# Patient Record
Sex: Female | Born: 1995
Health system: Southern US, Community
[De-identification: ages and names within clinical notes are randomized; demographics above are authoritative.]

---

## 2019-07-19 DIAGNOSIS — Z3A35 35 weeks gestation of pregnancy: Secondary | ICD-10-CM | POA: Diagnosis not present

## 2019-07-19 DIAGNOSIS — Z3685 Encounter for antenatal screening for Streptococcus B: Secondary | ICD-10-CM | POA: Diagnosis not present

## 2019-07-19 DIAGNOSIS — Z3689 Encounter for other specified antenatal screening: Secondary | ICD-10-CM | POA: Diagnosis not present

## 2019-07-19 DIAGNOSIS — Z3403 Encounter for supervision of normal first pregnancy, third trimester: Secondary | ICD-10-CM | POA: Diagnosis not present

## 2019-08-10 DIAGNOSIS — Z3493 Encounter for supervision of normal pregnancy, unspecified, third trimester: Secondary | ICD-10-CM | POA: Diagnosis not present

## 2019-08-10 DIAGNOSIS — Z3A36 36 weeks gestation of pregnancy: Secondary | ICD-10-CM | POA: Diagnosis not present

## 2019-08-19 DIAGNOSIS — O99344 Other mental disorders complicating childbirth: Secondary | ICD-10-CM | POA: Diagnosis not present

## 2019-08-19 DIAGNOSIS — O480 Post-term pregnancy: Secondary | ICD-10-CM | POA: Diagnosis not present

## 2019-08-19 DIAGNOSIS — Z87891 Personal history of nicotine dependence: Secondary | ICD-10-CM | POA: Diagnosis not present

## 2019-08-19 DIAGNOSIS — F329 Major depressive disorder, single episode, unspecified: Secondary | ICD-10-CM | POA: Diagnosis not present

## 2019-08-19 DIAGNOSIS — E669 Obesity, unspecified: Secondary | ICD-10-CM | POA: Diagnosis not present

## 2019-08-19 DIAGNOSIS — Z3A40 40 weeks gestation of pregnancy: Secondary | ICD-10-CM | POA: Diagnosis not present

## 2019-08-19 DIAGNOSIS — O99214 Obesity complicating childbirth: Secondary | ICD-10-CM | POA: Diagnosis not present

## 2019-08-19 DIAGNOSIS — Z20822 Contact with and (suspected) exposure to covid-19: Secondary | ICD-10-CM | POA: Diagnosis not present

## 2019-09-01 DIAGNOSIS — D649 Anemia, unspecified: Secondary | ICD-10-CM | POA: Diagnosis not present

## 2019-09-01 DIAGNOSIS — Z98891 History of uterine scar from previous surgery: Secondary | ICD-10-CM | POA: Diagnosis not present

## 2019-10-02 DIAGNOSIS — O9081 Anemia of the puerperium: Secondary | ICD-10-CM | POA: Diagnosis not present

## 2020-01-12 ENCOUNTER — Emergency Department (HOSPITAL_COMMUNITY): Payer: BC Managed Care – PPO

## 2020-01-12 ENCOUNTER — Ambulatory Visit: Payer: Self-pay

## 2020-01-12 ENCOUNTER — Other Ambulatory Visit: Payer: Self-pay

## 2020-01-12 ENCOUNTER — Encounter (HOSPITAL_COMMUNITY): Payer: Self-pay

## 2020-01-12 ENCOUNTER — Emergency Department (HOSPITAL_COMMUNITY)
Admission: EM | Admit: 2020-01-12 | Discharge: 2020-01-12 | Disposition: A | Discharge status: Home / Self Care | Payer: BC Managed Care – PPO | Attending: Emergency Medicine | Admitting: Emergency Medicine

## 2020-01-12 DIAGNOSIS — R0789 Other chest pain: Secondary | ICD-10-CM | POA: Diagnosis not present

## 2020-01-12 DIAGNOSIS — R0602 Shortness of breath: Secondary | ICD-10-CM | POA: Diagnosis not present

## 2020-01-12 DIAGNOSIS — F17210 Nicotine dependence, cigarettes, uncomplicated: Secondary | ICD-10-CM | POA: Insufficient documentation

## 2020-01-12 DIAGNOSIS — R079 Chest pain, unspecified: Secondary | ICD-10-CM | POA: Insufficient documentation

## 2020-01-12 LAB — BASIC METABOLIC PANEL
Anion gap: 8 (ref 5–15)
BUN: 17 mg/dL (ref 6–20)
CO2: 26 mmol/L (ref 22–32)
Calcium: 8.7 mg/dL — ABNORMAL LOW (ref 8.9–10.3)
Chloride: 105 mmol/L (ref 98–111)
Creatinine, Ser: 0.79 mg/dL (ref 0.44–1.00)
GFR calc Af Amer: >60 mL/min (ref >60)
GFR calc non Af Amer: >60 mL/min (ref >60)
Glucose, Bld: 84 mg/dL (ref 70–99)
Potassium: 4.1 mmol/L (ref 3.5–5.1)
Sodium: 139 mmol/L (ref 135–145)

## 2020-01-12 LAB — I-STAT BETA HCG BLOOD, ED (MC, WL, AP ONLY): I-stat hCG, quantitative: <5 m[IU]/mL (ref <5)

## 2020-01-12 LAB — CBC
HCT: 39.4 % (ref 36.0–46.0)
Hemoglobin: 12 g/dL (ref 12.0–15.0)
MCH: 25.6 pg — ABNORMAL LOW (ref 26.0–34.0)
MCHC: 30.5 g/dL (ref 30.0–36.0)
MCV: 84 fL (ref 80.0–100.0)
Platelets: 248 10*3/uL (ref 150–400)
RBC: 4.69 MIL/uL (ref 3.87–5.11)
RDW: 14.1 % (ref 11.5–15.5)
WBC: 5.9 10*3/uL (ref 4.0–10.5)
nRBC: 0 % (ref 0.0–0.2)

## 2020-01-12 LAB — TROPONIN I (HIGH SENSITIVITY): Troponin I (High Sensitivity): <2 ng/L (ref <18)

## 2020-01-12 MED ORDER — SODIUM CHLORIDE 0.9% FLUSH: 3.0000 mL | Freq: Once | INTRAVENOUS | Status: DC

## 2020-01-12 NOTE — Discharge Instructions (Addendum)
You were seen here for chest pain.  Labs and imaging all looked reassuring.  You can take Tylenol for pain, take every 6 hours please follow dosing instructions.  I have given you the contact information for [facility] they work with patients who have little to no insurance.  Please call them Monday to obtain a primary care provider and follow-up with them if pain persist after 1 week.  I want to come back to emergency department if you develop severe chest pain, shortness of breath, uncontrolled nausea, vomiting, fever as he symptoms require further evaluation and management.

## 2020-01-12 NOTE — ED Triage Notes (Signed)
Patient c/o intermittent mid chest pain x 4 days. Patient states occasional SOB. Patient states she now has mid back pain as well that started today. Patient denies any N/V or diaphoresis.

## 2020-01-12 NOTE — ED Provider Notes (Signed)
[facility]-EMERGENCY DEPT Provider Note   CSN: [id number] Arrival date & time: 01/12/20  1322     History Chief Complaint  Patient presents with  . Chest Pain    Alexis Sanchez is a 24 y.o. female.  HPI   Patient presents to the emergency department with chief complaint of chest pain that started on Monday.  Patient states the pain is intermittent and will last about an hour in duration.  Patient states she feels like there is someone sitting on her chest, admits to occasional shortness of breath as well as coming hot when the chest pain comes on.  She also states she recently started to feel some mid upper back pain but is unsure if it is from her work or from her chest pain.  Patient denies nausea, vomiting, becoming dizzy the other radiating pain during these chest pain episodes.  Patient denies experiencing this before, she denies any alleviating factors and cannot pinpoint any aggravating factors.  She denies take any medication for pain.  Patient has no significant medical history no PEs, DVTs, Marfan syndrome or other connective tissue disorder, no family history of it. she had a normal vaginal birth 4 months ago, she is not on any medication, denies prolonged periods of immobility, recent surgeries or trauma, smokes occasionally and does not take birth control.  Patient denies fever, chills, sore throat, cough, abdominal pain, nausea, vomiting, constipation, diarrhea, urinary symptoms, pedal edema.  History reviewed. No pertinent past medical history.  There are no problems to display for this patient.   History reviewed. No pertinent surgical history.   OB History   No obstetric history on file.     Family History  Family history unknown: Yes    Social History   Tobacco Use  . Smoking status: Current Every Day Smoker    Packs/day: 0.10    Types: Cigarettes  . Smokeless tobacco: Never Used  Vaping Use  . Vaping Use: Never used  Substance Use  Topics  . Alcohol use: Never  . Drug use: Never    Home Medications Prior to Admission medications   Not on File    Allergies    Patient has no known allergies.  Review of Systems   Review of Systems  Constitutional: Negative for chills and fever.  HENT: Negative for congestion, sore throat and tinnitus.   Eyes: Negative for visual disturbance.  Respiratory: Positive for shortness of breath.         occasional shortness of breath.  Cardiovascular: Positive for chest pain. Negative for palpitations and leg swelling.       Occasional chest pain  Gastrointestinal: Negative for abdominal pain, diarrhea, nausea and vomiting.  Genitourinary: Negative for dysuria, enuresis, flank pain and pelvic pain.  Musculoskeletal: Positive for back pain.       Upper back pain.  Skin: Negative for rash.  Neurological: Negative for dizziness, facial asymmetry and headaches.  Hematological: Does not bruise/bleed easily.    Physical Exam Updated Vital Signs BP 121/81 (BP Location: Left Arm)   Pulse 89   Temp 99.3 F (37.4 C) (Oral)   Resp 16   Ht 4\' 11"  (1.499 m)   Wt 84.4 kg   LMP 12/10/2019 (Approximate)   SpO2 98%   BMI 37.57 kg/m   Physical Exam Vitals and nursing note reviewed.  Constitutional:      General: She is not in acute distress.    Appearance: She is not ill-appearing.  HENT:  Head: Normocephalic and atraumatic.     Nose: No congestion.     Mouth/Throat:     Mouth: Mucous membranes are moist.     Pharynx: Oropharynx is clear.  Eyes:     General: No scleral icterus. Cardiovascular:     Rate and Rhythm: Normal rate and regular rhythm.     Pulses: Normal pulses.     Heart sounds: No murmur heard.  No friction rub. No gallop.   Pulmonary:     Effort: No respiratory distress.     Breath sounds: No wheezing, rhonchi or rales.  Abdominal:     General: There is no distension.     Palpations: Abdomen is soft.     Tenderness: There is no abdominal tenderness.  There is no guarding.  Musculoskeletal:        General: No swelling, tenderness, deformity or signs of injury.     Right lower leg: No edema.     Left lower leg: No edema.     Comments: Patient is back to visualize, no edema, erythema, lacerations, ecchymosis or other gross normalities noted.  Patient's back was palpated no step-off was noted along the patient spine, some tenderness along patient's right upper back.    Skin:    General: Skin is warm and dry.     Capillary Refill: Capillary refill takes less than 2 seconds.     Findings: No rash.  Neurological:     Mental Status: She is alert and oriented to person, place, and time.  Psychiatric:        Mood and Affect: Mood normal.     ED Results / Procedures / Treatments   Labs (all labs ordered are listed, but only abnormal results are displayed) Labs Reviewed  BASIC METABOLIC PANEL - Abnormal; Notable for the following components:      Result Value   Calcium 8.7 (*)    All other components within normal limits  CBC - Abnormal; Notable for the following components:   MCH 25.6 (*)    All other components within normal limits  I-STAT BETA HCG BLOOD, ED ([facility], [facility], [facility] ONLY)  TROPONIN I (HIGH SENSITIVITY)  TROPONIN I (HIGH SENSITIVITY)    EKG EKG Interpretation  Date/Time:  Friday January 12 2020 13:29:17 EDT Ventricular Rate:  100 PR Interval:  130 QRS Duration: 80 QT Interval:  340 QTC Calculation: 438 R Axis:   89 Text Interpretation: Normal sinus rhythm Normal ECG No old tracing to compare Confirmed by Meridee Score [phone]) on 01/12/2020 1:35:26 PM   Radiology DG Chest 2 View  Result Date: 01/12/2020 CLINICAL DATA:  Intermittent chest pain for 4 days. EXAM: CHEST - 2 VIEW COMPARISON:  None. FINDINGS: The heart size and mediastinal contours are normal. The lungs are clear. There is no pleural effusion or pneumothorax. No acute osseous findings are identified. IMPRESSION: No active cardiopulmonary process. Electronically  Signed   By: Carey Bullocks M.D.   On: 01/12/2020 16:08    Procedures Procedures (including critical care time)  Medications Ordered in ED Medications  sodium chloride flush (NS) 0.9 % injection 3 mL (has no administration in time range)    ED Course  I have reviewed the triage vital signs and the nursing notes.  Pertinent labs & imaging results that were available during my care of the patient were reviewed by me and considered in my medical decision making (see chart for details).    MDM Rules/Calculators/A&P  I have personally reviewed all imaging, labs and have interpreted them.  Due to patient's presentation concern for MI versus PE versus pneumonia versus anemia.  Unlikely patient is suffering from PE as she has low risk factors (not on birth control, no recent trauma or surgeries, no family history of DVT or PEs,) vital signs are reassuring, nontachycardic, nontachypneic, afebrile, she is PERC negative.  Unlikely patient suffering from pneumonia as chest x-ray did not show any acute abnormalities, no consolidation, infiltrates, edema.  Unlikely patient is suffering from an MI as EKG showed sinus rhythm without signs of ischemia, troponin  less than 2, patient is currently not having any chest pain at this time.  She has low cardiac risk, patient is young, nondiabetic, no history of hypertension or hyperlipidemia. Patient's BMP did not show any electrolyte abnormalities, no signs of AKI.  CBC did not show leukocytosis or anemia.  Patient appears to be resting comfortably in a chair, showing no signs of acute distress.  Vital signs have remained stable she does not meet criteria to be admitted to the hospital.  Working differential for patient's chest pain includes panic attack versus GERD versus anxiety.  Recommend patient follows up with her PCP for further evaluation and management.  Patient was discussed with attending who agrees with assessment and plan.   Patient will be discharged with home instructions as well as strict return precautions.  Patient was explained the results and plan patient states she understood and agrees with said plan. Final Clinical Impression(s) / ED Diagnoses Final diagnoses:  Nonspecific chest pain    Rx / DC Orders ED Discharge Orders    None       Marcello Fennel, PA-C 01/12/20 1712    Hayden Rasmussen, MD 01/13/20 1158

## 2020-01-12 NOTE — Telephone Encounter (Signed)
Patient called and states that she has been having chest pain since Monday /she states that the pain is center chest and travels into her back, arm and jaw.  She states she was checked at work and they told her that her HR was 101. BP she does not remember. She states that the pain comes and goes but today it has worsened. She rates the pain 5-6. She states that she does at times feel lightheaded. Her last menses May 23 rd. She is a smoker. Per protocol patient will go to ER for evaluation. Care advice read to patient.  She verbalized understanding of all information.  Reason for Disposition . [1] Chest pain (or "angina") comes and goes AND [2] is happening more often (increasing in frequency) or getting worse (increasing in severity) (Exception: chest pains that last only a few seconds)  Answer Assessment - Initial Assessment Questions 1. LOCATION: "Where does it hurt?"       center 2. RADIATION: "Does the pain go anywhere else?" (e.g., into neck, jaw, arms, back)     yes 3. ONSET: "When did the chest pain begin?" (Minutes, hours or days)     monday 4. PATTERN "Does the pain come and go, or has it been constant since it started?"  "Does it get worse with exertion?"      Comes and goes 5. DURATION: "How long does it last" (e.g., seconds, minutes, hours)     15-20 6. SEVERITY: "How bad is the pain?"  (e.g., Scale 1-10; mild, moderate, or severe)    - MILD (1-3): doesn't interfere with normal activities     - MODERATE (4-7): interferes with normal activities or awakens from sleep    - SEVERE (8-10): excruciating pain, unable to do any normal activities       5-6 7. CARDIAC RISK FACTORS: "Do you have any history of heart problems or risk factors for heart disease?" (e.g., angina, prior heart attack; diabetes, high blood pressure, high cholesterol, smoker, or strong family history of heart disease)    Smoker,  8. PULMONARY RISK FACTORS: "Do you have any history of lung disease?"  (e.g., blood  clots in lung, asthma, emphysema, birth control pills)    no 9. CAUSE: "What do you think is causing the chest pain?"     no 10. OTHER SYMPTOMS: "Do you have any other symptoms?" (e.g., dizziness, nausea, vomiting, sweating, fever, difficulty breathing, cough)       lightheaded 11. PREGNANCY: "Is there any chance you are pregnant?" "When was your last menstrual period?"       Last period May 23  Protocols used: CHEST PAIN-A-AH

## 2020-02-23 DIAGNOSIS — F411 Generalized anxiety disorder: Secondary | ICD-10-CM | POA: Diagnosis not present

## 2020-02-23 DIAGNOSIS — F4311 Post-traumatic stress disorder, acute: Secondary | ICD-10-CM | POA: Diagnosis not present

## 2020-03-01 DIAGNOSIS — F4310 Post-traumatic stress disorder, unspecified: Secondary | ICD-10-CM | POA: Diagnosis not present

## 2020-03-01 DIAGNOSIS — F411 Generalized anxiety disorder: Secondary | ICD-10-CM | POA: Diagnosis not present

## 2020-03-15 DIAGNOSIS — F4310 Post-traumatic stress disorder, unspecified: Secondary | ICD-10-CM | POA: Diagnosis not present

## 2020-03-15 DIAGNOSIS — F411 Generalized anxiety disorder: Secondary | ICD-10-CM | POA: Diagnosis not present

## 2020-03-26 ENCOUNTER — Emergency Department (HOSPITAL_COMMUNITY): Payer: BC Managed Care – PPO

## 2020-03-26 ENCOUNTER — Encounter (HOSPITAL_COMMUNITY): Payer: Self-pay

## 2020-03-26 ENCOUNTER — Other Ambulatory Visit: Payer: Self-pay

## 2020-03-26 ENCOUNTER — Emergency Department (HOSPITAL_COMMUNITY)
Admission: EM | Admit: 2020-03-26 | Discharge: 2020-03-26 | Disposition: A | Discharge status: Home / Self Care | Payer: BC Managed Care – PPO | Attending: Emergency Medicine | Admitting: Emergency Medicine

## 2020-03-26 DIAGNOSIS — S0219XA Other fracture of base of skull, initial encounter for closed fracture: Secondary | ICD-10-CM

## 2020-03-26 DIAGNOSIS — Y999 Unspecified external cause status: Secondary | ICD-10-CM | POA: Insufficient documentation

## 2020-03-26 DIAGNOSIS — R110 Nausea: Secondary | ICD-10-CM | POA: Diagnosis not present

## 2020-03-26 DIAGNOSIS — Z91040 Latex allergy status: Secondary | ICD-10-CM | POA: Diagnosis not present

## 2020-03-26 DIAGNOSIS — S199XXA Unspecified injury of neck, initial encounter: Secondary | ICD-10-CM | POA: Diagnosis not present

## 2020-03-26 DIAGNOSIS — S060X0A Concussion without loss of consciousness, initial encounter: Secondary | ICD-10-CM | POA: Diagnosis not present

## 2020-03-26 DIAGNOSIS — Y929 Unspecified place or not applicable: Secondary | ICD-10-CM | POA: Diagnosis not present

## 2020-03-26 DIAGNOSIS — Y939 Activity, unspecified: Secondary | ICD-10-CM | POA: Insufficient documentation

## 2020-03-26 DIAGNOSIS — W01198A Fall on same level from slipping, tripping and stumbling with subsequent striking against other object, initial encounter: Secondary | ICD-10-CM | POA: Diagnosis not present

## 2020-03-26 DIAGNOSIS — M542 Cervicalgia: Secondary | ICD-10-CM | POA: Diagnosis not present

## 2020-03-26 DIAGNOSIS — F17210 Nicotine dependence, cigarettes, uncomplicated: Secondary | ICD-10-CM | POA: Insufficient documentation

## 2020-03-26 DIAGNOSIS — W19XXXA Unspecified fall, initial encounter: Secondary | ICD-10-CM

## 2020-03-26 LAB — I-STAT BETA HCG BLOOD, ED (MC, WL, AP ONLY): I-stat hCG, quantitative: <5 m[IU]/mL (ref <5)

## 2020-03-26 LAB — I-STAT CHEM 8, ED
BUN: 10 mg/dL (ref 6–20)
Calcium, Ion: 1.23 mmol/L (ref 1.15–1.40)
Chloride: 102 mmol/L (ref 98–111)
Creatinine, Ser: 0.7 mg/dL (ref 0.44–1.00)
Glucose, Bld: 71 mg/dL (ref 70–99)
HCT: 45 % (ref 36.0–46.0)
Hemoglobin: 15.3 g/dL — ABNORMAL HIGH (ref 12.0–15.0)
Potassium: 3.7 mmol/L (ref 3.5–5.1)
Sodium: 140 mmol/L (ref 135–145)
TCO2: 25 mmol/L (ref 22–32)

## 2020-03-26 MED ORDER — MECLIZINE HCL 12.5 MG PO TABS
12.5000 mg | ORAL_TABLET | Freq: Three times a day (TID) | ORAL | 0 refills | Status: AC | PRN
Start: 2020-03-26 — End: ?

## 2020-03-26 MED ORDER — IOHEXOL 350 MG/ML SOLN
75.0000 mL | Freq: Once | INTRAVENOUS | Status: AC | PRN
  Administered 2020-03-26: 75 mL via INTRAVENOUS

## 2020-03-26 NOTE — ED Provider Notes (Signed)
[facility]-EMERGENCY DEPT Provider Note   CSN: [id number] Arrival date & time: 03/26/20  1231     History Chief Complaint  Patient presents with   Fall   Headache    Alexis Sanchez is a 24 y.o. female.  HPI Patient presents after head injury.  States she tripped and hit her head on the bed.  Swelling to left forehead.  Still has pain on the left side of her head but does go down the left lateral neck also.  States she feels dizzy.  States she feels unsteady.  States it is worse when she tries to move around.  Feels things are moving some.  No localizing numbness or weakness.    History reviewed. No pertinent past medical history.  There are no problems to display for this patient.   History reviewed. No pertinent surgical history.   OB History   No obstetric history on file.     Family History  Family history unknown: Yes    Social History   Tobacco Use   Smoking status: Current Every Day Smoker    Packs/day: 0.10    Types: Cigarettes   Smokeless tobacco: Never Used  Vaping Use   Vaping Use: Never used  Substance Use Topics   Alcohol use: Never   Drug use: Never    Home Medications Prior to Admission medications   Medication Sig Start Date End Date Taking? Authorizing Provider  meclizine (ANTIVERT) 12.5 MG tablet Take 1 tablet (12.5 mg total) by mouth 3 (three) times daily as needed for dizziness. 03/26/20   Benjiman Core, MD    Allergies    Latex  Review of Systems   Review of Systems  Constitutional: Negative for fever.  HENT: Negative for congestion.   Respiratory: Negative for shortness of breath.   Cardiovascular: Negative for chest pain.  Gastrointestinal: Positive for nausea. Negative for abdominal pain.  Genitourinary: Negative for flank pain.  Musculoskeletal: Positive for neck pain.  Neurological: Positive for dizziness and headaches.  Psychiatric/Behavioral: Negative for confusion.    Physical  Exam Updated Vital Signs BP 125/86    Pulse 80    Temp 98.4 F (36.9 C) (Oral)    Resp 18    LMP 12/10/2019    SpO2 99%   Physical Exam Vitals and nursing note reviewed.  HENT:     Head:     Comments: Mild hematoma left forehead. Eyes:     Pupils: Pupils are equal, round, and reactive to light.     Comments: Some nystagmus with gaze to left and right.  Does have conjugate gaze.  Neck:     Comments: Mild musculature tenderness to left side of neck.  No midline tenderness.  Good range of motion. Cardiovascular:     Rate and Rhythm: Regular rhythm.  Chest:     Chest wall: No tenderness.  Abdominal:     Tenderness: There is no abdominal tenderness. There is no guarding.  Musculoskeletal:     Cervical back: Neck supple.  Skin:    General: Skin is warm.  Neurological:     Mental Status: She is alert.  Psychiatric:        Mood and Affect: Mood normal.     ED Results / Procedures / Treatments   Labs (all labs ordered are listed, but only abnormal results are displayed) Labs Reviewed  I-STAT CHEM 8, ED - Abnormal; Notable for the following components:      Result Value   Hemoglobin 15.3 (*)  All other components within normal limits  I-STAT BETA HCG BLOOD, ED ([facility], [facility], [facility] ONLY)    EKG None  Radiology CT Angio Head W or Wo Contrast  Result Date: 03/26/2020 CLINICAL DATA:  Trauma EXAM: CT ANGIOGRAPHY HEAD AND NECK TECHNIQUE: Multidetector CT imaging of the head and neck was performed using the standard protocol during bolus administration of intravenous contrast. Multiplanar CT image reconstructions and MIPs were obtained to evaluate the vascular anatomy. Carotid stenosis measurements (when applicable) are obtained utilizing NASCET criteria, using the distal internal carotid diameter as the denominator. CONTRAST:  44mL OMNIPAQUE IOHEXOL 350 MG/ML SOLN COMPARISON:  None. FINDINGS: CT HEAD FINDINGS Brain: No acute infarct or intracranial hemorrhage. No mass lesion. No midline  shift, ventriculomegaly or extra-axial fluid collection. Vascular: No hyperdense vessel or unexpected calcification. Skull: Linear lucency involving the anterior table of the left frontal sinus (6:23, 12:14) with overlying left frontal scalp soft tissue swelling. Sinuses/Orbits: Normal orbits. Clear paranasal sinuses. No mastoid effusion. Other: None. Review of the MIP images confirms the above findings CTA NECK FINDINGS Aortic arch: Bovine arch anatomy. Imaged portion shows no evidence of aneurysm or dissection. No significant stenosis of the major arch vessel origins. Right carotid system: No evidence of dissection, stenosis (50% or greater) or occlusion. Left carotid system: No evidence of dissection, stenosis (50% or greater) or occlusion. Vertebral arteries: Right dominant system. No evidence of dissection, stenosis (50% or greater) or occlusion. Skeleton: No acute or suspicious osseous abnormalities within the neck. Other neck: No adenopathy.  No soft tissue mass. Upper chest: Subsegmental atelectasis. Review of the MIP images confirms the above findings CTA HEAD FINDINGS Anterior circulation: No significant stenosis, proximal occlusion, aneurysm, or vascular malformation. Posterior circulation: No significant stenosis, proximal occlusion, aneurysm, or vascular malformation. Venous sinuses: As permitted by contrast timing, patent. Anatomic variants: Fetal origin of the right PCA. Review of the MIP images confirms the above findings IMPRESSION: Nondisplaced fracture involving the left frontal sinus anterior table. Small overlying scalp hematoma. No acute intracranial process. No acute vascular injury within the head or neck. Electronically Signed   By: Stana Bunting M.D.   On: 03/26/2020 18:16   CT Angio Neck W and/or Wo Contrast  Result Date: 03/26/2020 CLINICAL DATA:  Trauma EXAM: CT ANGIOGRAPHY HEAD AND NECK TECHNIQUE: Multidetector CT imaging of the head and neck was performed using the standard  protocol during bolus administration of intravenous contrast. Multiplanar CT image reconstructions and MIPs were obtained to evaluate the vascular anatomy. Carotid stenosis measurements (when applicable) are obtained utilizing NASCET criteria, using the distal internal carotid diameter as the denominator. CONTRAST:  32mL OMNIPAQUE IOHEXOL 350 MG/ML SOLN COMPARISON:  None. FINDINGS: CT HEAD FINDINGS Brain: No acute infarct or intracranial hemorrhage. No mass lesion. No midline shift, ventriculomegaly or extra-axial fluid collection. Vascular: No hyperdense vessel or unexpected calcification. Skull: Linear lucency involving the anterior table of the left frontal sinus (6:23, 12:14) with overlying left frontal scalp soft tissue swelling. Sinuses/Orbits: Normal orbits. Clear paranasal sinuses. No mastoid effusion. Other: None. Review of the MIP images confirms the above findings CTA NECK FINDINGS Aortic arch: Bovine arch anatomy. Imaged portion shows no evidence of aneurysm or dissection. No significant stenosis of the major arch vessel origins. Right carotid system: No evidence of dissection, stenosis (50% or greater) or occlusion. Left carotid system: No evidence of dissection, stenosis (50% or greater) or occlusion. Vertebral arteries: Right dominant system. No evidence of dissection, stenosis (50% or greater) or occlusion. Skeleton: No acute or  suspicious osseous abnormalities within the neck. Other neck: No adenopathy.  No soft tissue mass. Upper chest: Subsegmental atelectasis. Review of the MIP images confirms the above findings CTA HEAD FINDINGS Anterior circulation: No significant stenosis, proximal occlusion, aneurysm, or vascular malformation. Posterior circulation: No significant stenosis, proximal occlusion, aneurysm, or vascular malformation. Venous sinuses: As permitted by contrast timing, patent. Anatomic variants: Fetal origin of the right PCA. Review of the MIP images confirms the above findings  IMPRESSION: Nondisplaced fracture involving the left frontal sinus anterior table. Small overlying scalp hematoma. No acute intracranial process. No acute vascular injury within the head or neck. Electronically Signed   By: Stana Bunting M.D.   On: 03/26/2020 18:16    Procedures Procedures (including critical care time)  Medications Ordered in ED Medications  iohexol (OMNIPAQUE) 350 MG/ML injection 75 mL (75 mLs Intravenous Contrast Given 03/26/20 1744)    ED Course  I have reviewed the triage vital signs and the nursing notes.  Pertinent labs & imaging results that were available during my care of the patient were reviewed by me and considered in my medical decision making (see chart for details).    MDM Rules/Calculators/A&P                          Patient with fall.  Hit head.  Has minor head injury with vertigo with it.  Head CT done and reassuring except for frontal sinus fracture.  However on mandible anterior table.  Maxillofacial trauma follow-up as needed.  CTA done due to neck pain with vertigo.  It was reassuring. Final Clinical Impression(s) / ED Diagnoses Final diagnoses:  Fall, initial encounter  Concussion without loss of consciousness, initial encounter  Closed fracture of frontal sinus, initial encounter (HCC)    Rx / DC Orders ED Discharge Orders         Ordered    meclizine (ANTIVERT) 12.5 MG tablet  3 times daily PRN        03/26/20 1857           Benjiman Core, MD 03/26/20 2312

## 2020-03-26 NOTE — ED Triage Notes (Signed)
Pt reports tripping over a blanket in the floor while getting out of bed. Patient fell and hit her head on side of bed. Knot noted on left upper forehead.   C/o 8/10 pain to forehead  A/Ox4 Ambulatory in triage  Denies passing out and states she just tripped.

## 2020-03-26 NOTE — ED Notes (Signed)
An After Visit Summary was printed and given to the patient. Discharge instructions given and no further questions at this time.  

## 2020-04-05 DIAGNOSIS — F411 Generalized anxiety disorder: Secondary | ICD-10-CM | POA: Diagnosis not present

## 2020-04-05 DIAGNOSIS — F4310 Post-traumatic stress disorder, unspecified: Secondary | ICD-10-CM | POA: Diagnosis not present

## 2020-04-26 DIAGNOSIS — F4310 Post-traumatic stress disorder, unspecified: Secondary | ICD-10-CM | POA: Diagnosis not present

## 2020-04-26 DIAGNOSIS — F411 Generalized anxiety disorder: Secondary | ICD-10-CM | POA: Diagnosis not present

## 2020-05-10 DIAGNOSIS — F411 Generalized anxiety disorder: Secondary | ICD-10-CM | POA: Diagnosis not present

## 2020-05-10 DIAGNOSIS — F4310 Post-traumatic stress disorder, unspecified: Secondary | ICD-10-CM | POA: Diagnosis not present

## 2020-05-24 DIAGNOSIS — F411 Generalized anxiety disorder: Secondary | ICD-10-CM | POA: Diagnosis not present

## 2020-05-24 DIAGNOSIS — F4310 Post-traumatic stress disorder, unspecified: Secondary | ICD-10-CM | POA: Diagnosis not present

## 2020-06-05 ENCOUNTER — Other Ambulatory Visit: Payer: Self-pay

## 2020-06-05 ENCOUNTER — Ambulatory Visit (INDEPENDENT_AMBULATORY_CARE_PROVIDER_SITE_OTHER): Payer: BC Managed Care – PPO | Admitting: Medical

## 2020-06-05 VITALS — BP 116/76 | HR 92 | Resp 18 | Ht 59.0 in | Wt 174.6 lb

## 2020-06-05 DIAGNOSIS — R102 Pelvic and perineal pain: Secondary | ICD-10-CM

## 2020-06-05 DIAGNOSIS — Z0000 Encounter for general adult medical examination without abnormal findings: Secondary | ICD-10-CM

## 2020-06-05 DIAGNOSIS — R5383 Other fatigue: Secondary | ICD-10-CM | POA: Diagnosis not present

## 2020-06-05 DIAGNOSIS — N912 Amenorrhea, unspecified: Secondary | ICD-10-CM

## 2020-06-05 LAB — POC URINALSYSI DIPSTICK (AUTOMATED)
Bilirubin, UA: NEGATIVE
Blood, UA: NEGATIVE
Glucose, UA: NEGATIVE
Ketones, UA: NEGATIVE
Leukocytes, UA: NEGATIVE
Nitrite, UA: NEGATIVE
Protein, UA: NEGATIVE
Spec Grav, UA: 1.02 (ref 1.010–1.025)
Urobilinogen, UA: 0.2 E.U./dL
pH, UA: 6 (ref 5.0–8.0)

## 2020-06-05 LAB — POCT URINE PREGNANCY: Preg Test, Ur: NEGATIVE

## 2020-06-05 NOTE — Patient Instructions (Addendum)
For you wellness exam today I have ordered cbc, cmp and lipid panel.  For fatigue ordered tsh, t4, b12 and b1 level.  For ammenorhea 6 months placed referral to gyn. Pregnancy test negative today.  Flu vaccine today.  Recommend exercise and healthy diet.  We will let you know lab results as they come in.  For suprapbic pain near section site will get ua and urine culture.  Counseled on concussions regarding prior ED visit.  Follow up 2 weeks or as needed.  Preventive Care 23-37 Years Old, Female Preventive care refers to visits with your health care provider and lifestyle choices that can promote health and wellness. This includes:  A yearly physical exam. This may also be called an annual well check.  Regular dental visits and eye exams.  Immunizations.  Screening for certain conditions.  Healthy lifestyle choices, such as eating a healthy diet, getting regular exercise, not using drugs or products that contain nicotine and tobacco, and limiting alcohol use. What can I expect for my preventive care visit? Physical exam Your health care provider will check your:  Height and weight. This may be used to calculate body mass index (BMI), which tells if you are at a healthy weight.  Heart rate and blood pressure.  Skin for abnormal spots. Counseling Your health care provider may ask you questions about your:  Alcohol, tobacco, and drug use.  Emotional well-being.  Home and relationship well-being.  Sexual activity.  Eating habits.  Work and work Statistician.  Method of birth control.  Menstrual cycle.  Pregnancy history. What immunizations do I need?  Influenza (flu) vaccine  This is recommended every year. Tetanus, diphtheria, and pertussis (Tdap) vaccine  You may need a Td booster every 10 years. Varicella (chickenpox) vaccine  You may need this if you have not been vaccinated. Human papillomavirus (HPV) vaccine  If recommended by your health care  provider, you may need three doses over 6 months. Measles, mumps, and rubella (MMR) vaccine  You may need at least one dose of MMR. You may also need a second dose. Meningococcal conjugate (MenACWY) vaccine  One dose is recommended if you are age 26-21 years and a first-year college student living in a residence hall, or if you have one of several medical conditions. You may also need additional booster doses. Pneumococcal conjugate (PCV13) vaccine  You may need this if you have certain conditions and were not previously vaccinated. Pneumococcal polysaccharide (PPSV23) vaccine  You may need one or two doses if you smoke cigarettes or if you have certain conditions. Hepatitis A vaccine  You may need this if you have certain conditions or if you travel or work in places where you may be exposed to hepatitis A. Hepatitis B vaccine  You may need this if you have certain conditions or if you travel or work in places where you may be exposed to hepatitis B. Haemophilus influenzae type b (Hib) vaccine  You may need this if you have certain conditions. You may receive vaccines as individual doses or as more than one vaccine together in one shot (combination vaccines). Talk with your health care provider about the risks and benefits of combination vaccines. What tests do I need?  Blood tests  Lipid and cholesterol levels. These may be checked every 5 years starting at age 12.  Hepatitis C test.  Hepatitis B test. Screening  Diabetes screening. This is done by checking your blood sugar (glucose) after you have not eaten for a while (  fasting).  Sexually transmitted disease (STD) testing.  BRCA-related cancer screening. This may be done if you have a family history of breast, ovarian, tubal, or peritoneal cancers.  Pelvic exam and Pap test. This may be done every 3 years starting at age 38. Starting at age 85, this may be done every 5 years if you have a Pap test in combination with an  HPV test. Talk with your health care provider about your test results, treatment options, and if necessary, the need for more tests. Follow these instructions at home: Eating and drinking   Eat a diet that includes fresh fruits and vegetables, whole grains, lean protein, and low-fat dairy.  Take vitamin and mineral supplements as recommended by your health care provider.  Do not drink alcohol if: ? Your health care provider tells you not to drink. ? You are pregnant, may be pregnant, or are planning to become pregnant.  If you drink alcohol: ? Limit how much you have to 0-1 drink a day. ? Be aware of how much alcohol is in your drink. In the [place] one drink equals one 12 oz bottle of beer (355 mL), one 5 oz glass of wine (148 mL), or one 1 oz glass of hard liquor (44 mL). Lifestyle  Take daily care of your teeth and gums.  Stay active. Exercise for at least 30 minutes on 5 or more days each week.  Do not use any products that contain nicotine or tobacco, such as cigarettes, e-cigarettes, and chewing tobacco. If you need help quitting, ask your health care provider.  If you are sexually active, practice safe sex. Use a condom or other form of birth control (contraception) in order to prevent pregnancy and STIs (sexually transmitted infections). If you plan to become pregnant, see your health care provider for a preconception visit. What's next?  Visit your health care provider once a year for a well check visit.  Ask your health care provider how often you should have your eyes and teeth checked.  Stay up to date on all vaccines. This information is not intended to replace advice given to you by your health care provider. Make sure you discuss any questions you have with your health care provider. Document Revised: 03/17/2018 Document Reviewed: 03/17/2018 Elsevier Patient Education  2020 [employer].

## 2020-06-05 NOTE — Progress Notes (Signed)
Subjective:    Patient ID: Alexis Sanchez, female    DOB: 13-Jan-1996, 24 y.o.   MRN: [id number]  HPI  Pt in for first time  Pt has prior pcp. 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 Does not exercise. Pt eats moderate healthy. Non smoker. Does smoke marijuana once daily. Occasional alcohol use at most once a month. Pt has one daughter 51 months old.  Pt states no chronic medical problems.  Pt will get flu vaccine today.  Will go ahead and get wellness exam lab/cpe.  No menses for 6 months. At most has spotted. Pt is not on any contraceptive. Pt has not been sexually active for 2 months.  Pt has always had regular menstrual cycle prior to child birth. Pt add best every month or so for an hour or two past 2 months or so.  Moderate fatigue.  Reviewed pt ED visit in September. Forgetful at time since concussion.    Review of Systems  Constitutional: Negative for chills, fatigue and fever.  Respiratory: Negative for cough, chest tightness, shortness of breath and wheezing.   Cardiovascular: Negative for chest pain and palpitations.  Gastrointestinal: Negative for abdominal pain.  Musculoskeletal: Negative for back pain.  Skin: Negative for rash.  Neurological: Negative for dizziness and headaches.  Hematological: Negative for adenopathy. Does not bruise/bleed easily.  Psychiatric/Behavioral: Negative for behavioral problems, decreased concentration and dysphoric mood. The patient is not hyperactive.     No past medical history on file.   Social History   Socioeconomic History  . Marital status: Single    Spouse name: Not on file  . Number of children: Not on file  . Years of education: Not on file  . Highest education level: Not on file  Occupational History  . Not on file  Tobacco Use  . Smoking status: Current Every Day Smoker    Packs/day: 0.10    Types: Cigarettes  . Smokeless tobacco: Never Used  Vaping Use  . Vaping Use: Never used  Substance and Sexual  Activity  . Alcohol use: Never  . Drug use: Never  . Sexual activity: Not on file  Other Topics Concern  . Not on file  Social History Narrative  . Not on file   Social Determinants of Health   Financial Resource Strain:   . Difficulty of Paying Living Expenses: Not on file  Food Insecurity:   . Worried About Programme researcher, broadcasting/film/video in the Last Year: Not on file  . Ran Out of Food in the Last Year: Not on file  Transportation Needs:   . Lack of Transportation (Medical): Not on file  . Lack of Transportation (Non-Medical): Not on file  Physical Activity:   . Days of Exercise per Week: Not on file  . Minutes of Exercise per Session: Not on file  Stress:   . Feeling of Stress : Not on file  Social Connections:   . Frequency of Communication with Friends and Family: Not on file  . Frequency of Social Gatherings with Friends and Family: Not on file  . Attends Religious Services: Not on file  . Active Member of Clubs or Organizations: Not on file  . Attends Banker Meetings: Not on file  . Marital Status: Not on file  Intimate Partner Violence:   . Fear of Current or Ex-Partner: Not on file  . Emotionally Abused: Not on file  . Physically Abused: Not on file  . Sexually Abused: Not on file  No past surgical history on file.  Family History  Family history unknown: Yes    Allergies  Allergen Reactions  . Latex Rash    Current Outpatient Medications on File Prior to Visit  Medication Sig Dispense Refill  . meclizine (ANTIVERT) 12.5 MG tablet Take 1 tablet (12.5 mg total) by mouth 3 (three) times daily as needed for dizziness. (Patient not taking: Reported on 06/05/2020) 10 tablet 0   No current facility-administered medications on file prior to visit.    BP 116/76   Pulse 92   Resp 18   Ht 4\' 11"  (1.499 m)   Wt 174 lb 9.6 oz (79.2 kg)   LMP 12/10/2019   SpO2 100%   BMI 35.26 kg/m       Objective:   Physical Exam  General Mental Status- Alert.  General Appearance- Not in acute distress.   Skin General: Color- Normal Color. Moisture- Normal Moisture.  Neck Carotid Arteries- Normal color. Moisture- Normal Moisture. No carotid bruits. No JVD.  Chest and Lung Exam Auscultation: Breath Sounds:-Normal.  Cardiovascular Auscultation:Rythm- Regular. Murmurs & Other Heart Sounds:Auscultation of the heart reveals- No Murmurs.  Abdomen Inspection:-Inspeection Normal. Palpation/Percussion:Note:No mass. Palpation and Percussion of the abdomen reveal- Non Tender, Non Distended + BS, no rebound or guarding.  Back- no cva tenderness.  Neurologic Cranial Nerve exam:- CN III-XII intact(No nystagmus), symmetric smile. Drift Test:- No drift. Finger to Nose:- Normal/Intact Strength:- 5/5 equal and symmetric strength both upper and lower extremities. Has some bells palsy finding left side. 5 years ago got.      Assessment & Plan:  For you wellness exam today I have ordered cbc, cmp and lipid panel.  For fatigue ordered tsh, t4, b12 and b1 level.  For ammenorhea 6 months placed referral to gyn. Pregnancy test negative today.  Flu vaccine today.  Recommend exercise and healthy diet.  We will let you know lab results as they come in.  Counseled on concussions regarding prior ED visit.  Follow up 2 weeks or as needed  12/12/2019, PA-C

## 2020-06-06 LAB — URINE CULTURE
MICRO NUMBER:: 11215501
SPECIMEN QUALITY:: ADEQUATE

## 2020-06-06 LAB — CBC WITH DIFFERENTIAL/PLATELET
Basophils Absolute: 0 10*3/uL (ref 0.0–0.1)
Basophils Relative: 0.6 % (ref 0.0–3.0)
Eosinophils Absolute: 0.1 10*3/uL (ref 0.0–0.7)
Eosinophils Relative: 1.3 % (ref 0.0–5.0)
HCT: 38 % (ref 36.0–46.0)
Hemoglobin: 12.4 g/dL (ref 12.0–15.0)
Lymphocytes Relative: 22.9 % (ref 12.0–46.0)
Lymphs Abs: 1.6 10*3/uL (ref 0.7–4.0)
MCHC: 32.7 g/dL (ref 30.0–36.0)
MCV: 81.1 fl (ref 78.0–100.0)
Monocytes Absolute: 0.5 10*3/uL (ref 0.1–1.0)
Monocytes Relative: 7.2 % (ref 3.0–12.0)
Neutro Abs: 4.7 10*3/uL (ref 1.4–7.7)
Neutrophils Relative %: 68 % (ref 43.0–77.0)
Platelets: 250 10*3/uL (ref 150.0–400.0)
RBC: 4.69 Mil/uL (ref 3.87–5.11)
RDW: 14.1 % (ref 11.5–15.5)
WBC: 6.9 10*3/uL (ref 4.0–10.5)

## 2020-06-06 LAB — COMPREHENSIVE METABOLIC PANEL
ALT: 13 U/L (ref 0–35)
AST: 16 U/L (ref 0–37)
Albumin: 4.2 g/dL (ref 3.5–5.2)
Alkaline Phosphatase: 55 U/L (ref 39–117)
BUN: 13 mg/dL (ref 6–23)
CO2: 28 mEq/L (ref 19–32)
Calcium: 9.1 mg/dL (ref 8.4–10.5)
Chloride: 104 mEq/L (ref 96–112)
Creatinine, Ser: 0.81 mg/dL (ref 0.40–1.20)
GFR: 101.87 mL/min (ref >60.00)
Glucose, Bld: 75 mg/dL (ref 70–99)
Potassium: 4.1 mEq/L (ref 3.5–5.1)
Sodium: 139 mEq/L (ref 135–145)
Total Bilirubin: 0.2 mg/dL (ref 0.2–1.2)
Total Protein: 6.6 g/dL (ref 6.0–8.3)

## 2020-06-06 LAB — LIPID PANEL
Cholesterol: 160 mg/dL (ref 0–200)
HDL: 65.8 mg/dL (ref >39.00)
LDL Cholesterol: 81 mg/dL (ref 0–99)
NonHDL: 93.91
Total CHOL/HDL Ratio: 2
Triglycerides: 66 mg/dL (ref 0.0–149.0)
VLDL: 13.2 mg/dL (ref 0.0–40.0)

## 2020-06-06 LAB — VITAMIN B12: Vitamin B-12: 278 pg/mL (ref 211–911)

## 2020-06-06 LAB — TSH: TSH: 0.82 u[IU]/mL (ref 0.35–4.50)

## 2020-06-06 LAB — T4, FREE: Free T4: 0.84 ng/dL (ref 0.60–1.60)

## 2020-06-07 DIAGNOSIS — F4310 Post-traumatic stress disorder, unspecified: Secondary | ICD-10-CM | POA: Diagnosis not present

## 2020-06-07 DIAGNOSIS — F411 Generalized anxiety disorder: Secondary | ICD-10-CM | POA: Diagnosis not present

## 2020-06-08 LAB — VITAMIN B1: Vitamin B1 (Thiamine): 16 nmol/L (ref 8–30)

## 2020-06-19 ENCOUNTER — Ambulatory Visit (INDEPENDENT_AMBULATORY_CARE_PROVIDER_SITE_OTHER): Payer: BC Managed Care – PPO | Admitting: Medical

## 2020-06-19 ENCOUNTER — Other Ambulatory Visit: Payer: Self-pay

## 2020-06-19 ENCOUNTER — Encounter: Payer: Self-pay | Admitting: Medical

## 2020-06-19 VITALS — BP 110/78 | HR 117 | Temp 98.7°F | Resp 18 | Ht 59.0 in | Wt 175.2 lb

## 2020-06-19 DIAGNOSIS — R142 Eructation: Secondary | ICD-10-CM | POA: Diagnosis not present

## 2020-06-19 DIAGNOSIS — R0789 Other chest pain: Secondary | ICD-10-CM | POA: Diagnosis not present

## 2020-06-19 DIAGNOSIS — R0600 Dyspnea, unspecified: Secondary | ICD-10-CM

## 2020-06-19 MED ORDER — ALBUTEROL SULFATE HFA 108 (90 BASE) MCG/ACT IN AERS: 2.0000 | INHALATION_SPRAY | Freq: Four times a day (QID) | RESPIRATORY_TRACT | 0 refills | Status: AC | PRN

## 2020-06-19 NOTE — Progress Notes (Signed)
Subjective:    Patient ID: Alexis Sanchez, female    DOB: [birth year], 23 y.o.   MRN: [id number]  HPI Pt had some chest pain on Sunday. Lasted for 1 hour. Happened after she ate. Chest pain occurs about once or twice a week. No other associated signs or symptoms. Lower left rib, epigastric and mid chest pain.   No obvious heart burn. Does belch randomly.  Hx of smoking. Smoked since 24 yo.1-2 cig a day. No asthma. No wheezing. Last 2 weeks. Sob more at rest.  Some fatigue last 2 weeks as well.  Note not having chest pain and dyspnea at same time    Review of Systems  Constitutional: Negative for chills, fatigue and fever.  HENT: Negative for congestion.   Respiratory: Negative for cough, chest tightness, shortness of breath and wheezing.        Mld rare episodes sob at rest per pt.  Cardiovascular: Positive for chest pain. Negative for palpitations.       See hpi. No chest pain presently.  Gastrointestinal: Negative for abdominal pain.  Genitourinary: Negative for dysuria.  Musculoskeletal: Negative for back pain.  Neurological: Negative for dizziness, facial asymmetry, speech difficulty, weakness and headaches.  Hematological: Negative for adenopathy. Does not bruise/bleed easily.  Psychiatric/Behavioral: Negative for behavioral problems and confusion.    No past medical history on file.   Social History   Socioeconomic History  . Marital status: Single    Spouse name: Not on file  . Number of children: Not on file  . Years of education: Not on file  . Highest education level: Not on file  Occupational History  . Not on file  Tobacco Use  . Smoking status: Current Every Day Smoker    Packs/day: 0.10    Types: Cigarettes  . Smokeless tobacco: Never Used  Vaping Use  . Vaping Use: Never used  Substance and Sexual Activity  . Alcohol use: Never  . Drug use: Never  . Sexual activity: Not on file  Other Topics Concern  . Not on file  Social History Narrative    . Not on file   Social Determinants of Health   Financial Resource Strain:   . Difficulty of Paying Living Expenses: Not on file  Food Insecurity:   . Worried About Programme researcher, broadcasting/film/video in the Last Year: Not on file  . Ran Out of Food in the Last Year: Not on file  Transportation Needs:   . Lack of Transportation (Medical): Not on file  . Lack of Transportation (Non-Medical): Not on file  Physical Activity:   . Days of Exercise per Week: Not on file  . Minutes of Exercise per Session: Not on file  Stress:   . Feeling of Stress : Not on file  Social Connections:   . Frequency of Communication with Friends and Family: Not on file  . Frequency of Social Gatherings with Friends and Family: Not on file  . Attends Religious Services: Not on file  . Active Member of Clubs or Organizations: Not on file  . Attends Banker Meetings: Not on file  . Marital Status: Not on file  Intimate Partner Violence:   . Fear of Current or Ex-Partner: Not on file  . Emotionally Abused: Not on file  . Physically Abused: Not on file  . Sexually Abused: Not on file    No past surgical history on file.  Family History  Family history unknown: Yes    Allergies  Allergen Reactions  . Latex Rash    Current Outpatient Medications on File Prior to Visit  Medication Sig Dispense Refill  . meclizine (ANTIVERT) 12.5 MG tablet Take 1 tablet (12.5 mg total) by mouth 3 (three) times daily as needed for dizziness. (Patient not taking: Reported on 06/05/2020) 10 tablet 0   No current facility-administered medications on file prior to visit.    BP 110/78 (BP Location: Left Arm, Patient Position: Sitting, Cuff Size: Normal)   Pulse (!) 117   Temp 98.7 F (37.1 C) (Oral)   Resp 18   Ht 4\' 11"  (1.499 m)   Wt 175 lb 3.2 oz (79.5 kg)   SpO2 98%   BMI 35.39 kg/m       Objective:   Physical Exam  General Mental Status- Alert. General Appearance- Not in acute distress.   Skin General:  Color- Normal Color. Moisture- Normal Moisture.  Neck Carotid Arteries- Normal color. Moisture- Normal Moisture. No carotid bruits. No JVD.  Chest and Lung Exam Auscultation: Breath Sounds:-Normal.  Anterior chest- costochandral tenderness to palpation.  Cardiovascular Auscultation:Rythm- Regular. Murmurs & Other Heart Sounds:Auscultation of the heart reveals- No Murmurs.  Abdomen Inspection:-Inspeection Normal. Palpation/Percussion:Note:No mass. Palpation and Percussion of the abdomen reveal- Non Tender, Non Distended + BS, no rebound or guarding.  Neurologic Cranial Nerve exam:- CN III-XII intact(No nystagmus), symmetric smile. Strength:- 5/5 equal and symmetric strength both upper and lower extremities.      Assessment & Plan:  You have rare intermittent atypical chest pain that occur briefl but no pain presently. Your ekg shows sinus rhythm. Would recommend that you take ibuprofen 200-400 mg every 8 hours for next 7-10 days. Want to see if this will eliminate episodes of transient pain. It is possible that you have chest wall muscle or cartlidge pain. If pain worsens or changes then be seen in ED.   Also recent belching. Can use pepcid over the counter daily while using ibuprofen.  If low level atypical chest pain reoccurs on regular basis then would refer to cardiologist but very low suspicion based on age group.  For hx of smoking and dyspnea will get cxr and rx albtuerol to use if needed.  Follow up 7-10 days or as needed  Pt does still have ammenorhea. She is going to see gyn next week.(pt is not sexually)  , PA-C

## 2020-06-19 NOTE — Patient Instructions (Addendum)
You have rare intermittent atypical chest pain that occur briefl but no pain presently. Your ekg shows sinus rhythm. Would recommend that you take ibuprofen 200-400 mg every 8 hours for next 7-10 days. Want to see if this will eliminate episodes of transient pain. It is possible that you have chest wall muscle or cartlidge pain(pain on exam reproducible). If pain worsens or changes then be seen in ED.    Also recent belching. Can use pepcid over the counter daily while using ibuprofen.  If low level atypical chest pain reoccurs on regular basis then would refer to cardiologist but very low suspicion based on age group.  For hx of smoking and dyspnea will get cxr and rx albtuerol to use if needed.  Follow up 7-10 days or as needed

## 2020-06-20 ENCOUNTER — Ambulatory Visit (HOSPITAL_BASED_OUTPATIENT_CLINIC_OR_DEPARTMENT_OTHER)
Admission: RE | Admit: 2020-06-20 | Discharge: 2020-06-20 | Disposition: A | Discharge status: Home / Self Care | Payer: BC Managed Care – PPO | Source: Ambulatory Visit | Attending: Medical | Admitting: Medical

## 2020-06-20 DIAGNOSIS — R079 Chest pain, unspecified: Secondary | ICD-10-CM | POA: Diagnosis not present

## 2020-06-20 DIAGNOSIS — R059 Cough, unspecified: Secondary | ICD-10-CM | POA: Diagnosis not present

## 2020-06-20 DIAGNOSIS — R0600 Dyspnea, unspecified: Secondary | ICD-10-CM | POA: Insufficient documentation

## 2020-06-21 ENCOUNTER — Telehealth: Payer: Self-pay

## 2020-06-21 DIAGNOSIS — F4310 Post-traumatic stress disorder, unspecified: Secondary | ICD-10-CM | POA: Diagnosis not present

## 2020-06-21 DIAGNOSIS — F411 Generalized anxiety disorder: Secondary | ICD-10-CM | POA: Diagnosis not present

## 2020-06-21 NOTE — Telephone Encounter (Signed)
-----   Message from Esperanza Richters, PA-C sent at 06/20/2020  6:06 PM EST ----- Your chest xray came back negative.

## 2020-06-21 NOTE — Telephone Encounter (Signed)
Attempted to call pt to go over chest x-ray. LVM to call back.

## 2020-07-02 ENCOUNTER — Telehealth: Payer: Self-pay | Admitting: Medical

## 2020-07-03 ENCOUNTER — Encounter: Payer: Self-pay | Admitting: Family Medicine

## 2020-07-03 ENCOUNTER — Ambulatory Visit (INDEPENDENT_AMBULATORY_CARE_PROVIDER_SITE_OTHER): Payer: BC Managed Care – PPO | Admitting: Family Medicine

## 2020-07-03 ENCOUNTER — Other Ambulatory Visit: Payer: Self-pay

## 2020-07-03 ENCOUNTER — Encounter: Payer: BC Managed Care – PPO | Admitting: Medical

## 2020-07-03 ENCOUNTER — Telehealth: Payer: Self-pay | Admitting: Medical

## 2020-07-03 VITALS — BP 134/89 | HR 118 | Ht 61.0 in | Wt 173.0 lb

## 2020-07-03 DIAGNOSIS — N911 Secondary amenorrhea: Secondary | ICD-10-CM | POA: Diagnosis not present

## 2020-07-03 MED ORDER — MEDROXYPROGESTERONE ACETATE 10 MG PO TABS: 10.0000 mg | ORAL_TABLET | Freq: Every day | ORAL | 0 refills | Status: AC

## 2020-07-03 NOTE — Progress Notes (Signed)
Patient states she has not had a period since May 2021. Patient had a delivery in Feb 2021 and had a cycle after that delivery. Patient reports taking multiple pregnancy tests and all have been negative. Armandina Stammer RN

## 2020-07-03 NOTE — Telephone Encounter (Signed)
Opened to review 

## 2020-07-03 NOTE — Progress Notes (Signed)
   Subjective:    Patient ID: Emilya Justen, female    DOB: Oct 26, 1995, 24 y.o.   MRN: [id number]  HPI G1P1 who is s/p c/s in February 2021 for arrest of dilation, fetal intolerance of labor. Last bleeding was in May 2021 and has not had a period since then. Denies severe PPH with delivery or prolonged period of hypotension. Denies migraine headaches.   Prior to pregnancy, interval of 28-30 days, bleeding for 3-4 days.  No family h/o DVT, denies tobacco abuse.  I have reviewed the patients past medical, family, and social history.  I have reviewed the patient's medication list and allergies.    Review of Systems     Objective:   Physical Exam Vitals reviewed. Exam conducted with a chaperone present.  Constitutional:      Appearance: Normal appearance.  Abdominal:     General: Abdomen is flat. There is no distension.     Palpations: Abdomen is soft. There is no mass.     Tenderness: There is no abdominal tenderness.     Hernia: No hernia is present. There is no hernia in the left inguinal area or right inguinal area.  Genitourinary:    Labia:        Right: No rash, tenderness or lesion.        Left: No rash, tenderness or lesion.      Vagina: No signs of injury and foreign body. No vaginal discharge, erythema, tenderness or bleeding.     Cervix: No cervical motion tenderness, discharge, friability or lesion.     Uterus: Normal.   Lymphadenopathy:     Lower Body: No right inguinal adenopathy. No left inguinal adenopathy.  Neurological:     Mental Status: She is alert.  Psychiatric:        Mood and Affect: Mood normal.        Behavior: Behavior normal.        Thought Content: Thought content normal.        Judgment: Judgment normal.        Assessment & Plan:  1. Secondary amenorrhea Only 10 months from pregnancy. Does not appear that had an event peripartum that would precipitate an ischemic pituitary accident. May be that prolactin is still elevated. Will check labs  and [place]. F/u in 1 month. - TSH - Hemoglobin A1c - Follicle stimulating hormone - LH - Prolactin - US PELVIS TRANSVAGINAL NON-OB (TV ONLY); Future

## 2020-07-03 NOTE — Telephone Encounter (Signed)
Please fax or my chart her excuse note today

## 2020-07-03 NOTE — Telephone Encounter (Signed)
Pt had an appt today with provider for her cpe (07-03-2020), pt was informed provider was not going to be in office today and needed to reschedule, pt states is needing a note for work since pt took the day off from work, pt mentioned could get in trouble since took the day off due to her appt. Please advise. Pt mentioned ok to send letter through mychart. Any question call pt at [phone].

## 2020-07-03 NOTE — Telephone Encounter (Signed)
Pt notified and letter emailed to patient

## 2020-07-04 DIAGNOSIS — F411 Generalized anxiety disorder: Secondary | ICD-10-CM | POA: Diagnosis not present

## 2020-07-04 DIAGNOSIS — F4310 Post-traumatic stress disorder, unspecified: Secondary | ICD-10-CM | POA: Diagnosis not present

## 2020-07-04 LAB — TSH: TSH: 0.723 u[IU]/mL (ref 0.450–4.500)

## 2020-07-04 LAB — HEMOGLOBIN A1C
Est. average glucose Bld gHb Est-mCnc: 114 mg/dL
Hgb A1c MFr Bld: 5.6 % (ref 4.8–5.6)

## 2020-07-04 LAB — PROLACTIN: Prolactin: 12.6 ng/mL (ref 4.8–23.3)

## 2020-07-04 LAB — LUTEINIZING HORMONE: LH: 5 m[IU]/mL

## 2020-07-04 LAB — FOLLICLE STIMULATING HORMONE: FSH: 3.1 m[IU]/mL

## 2020-07-17 ENCOUNTER — Other Ambulatory Visit: Payer: Self-pay

## 2020-07-17 ENCOUNTER — Ambulatory Visit (INDEPENDENT_AMBULATORY_CARE_PROVIDER_SITE_OTHER): Payer: BC Managed Care – PPO | Admitting: Medical

## 2020-07-17 VITALS — BP 123/69 | HR 76 | Temp 98.1°F | Resp 18 | Ht 61.0 in | Wt 168.0 lb

## 2020-07-17 DIAGNOSIS — R5383 Other fatigue: Secondary | ICD-10-CM

## 2020-07-17 DIAGNOSIS — J4520 Mild intermittent asthma, uncomplicated: Secondary | ICD-10-CM | POA: Diagnosis not present

## 2020-07-17 NOTE — Progress Notes (Signed)
Subjective:    Patient ID: Alexis Sanchez, female    DOB: Apr 25, 1996, 24 y.o.   MRN: [id number]  HPI  Pt in to fill out form associated with nov cpe.   Pt also here for follow up on atypical chest pain. None recurrent. She states since she used inhaler no pain that persists. States feel more slight constricted. Will use albuterol inhaler and then quickly sensation will resolve.  Pt does smoke marijuana. Smokes joint about 4-5 times a week.  Pt states her energy level is improved. She thinks quality of sleep effects her energy. Wakes up same time every day at 5:45. Pt does not think she snores. Pt gets about 6-8 hours of sleep at night.  Pt b12 was on lower end of normal.     Review of Systems  Constitutional: Positive for fatigue. Negative for chills and fever.  HENT: Negative for congestion, ear pain, mouth sores and nosebleeds.   Respiratory: Negative for cough, chest tightness, shortness of breath and wheezing.   Cardiovascular: Negative for chest pain and palpitations.  Gastrointestinal: Negative for abdominal pain.  Genitourinary: Negative for dysuria.  Musculoskeletal: Negative for back pain.  Skin: Negative for rash.  Neurological: Negative for dizziness, seizures, syncope, weakness, numbness and headaches.  Hematological: Negative for adenopathy. Does not bruise/bleed easily.  Psychiatric/Behavioral: Negative for behavioral problems, confusion, sleep disturbance and suicidal ideas. The patient is not nervous/anxious.     No past medical history on file.   Social History   Socioeconomic History   Marital status: Single    Spouse name: Not on file   Number of children: Not on file   Years of education: Not on file   Highest education level: Not on file  Occupational History   Not on file  Tobacco Use   Smoking status: Former Smoker    Packs/day: 0.10    Types: Cigarettes   Smokeless tobacco: Never Used  Vaping Use   Vaping Use: Never used  Substance  and Sexual Activity   Alcohol use: Never   Drug use: Yes    Types: Marijuana   Sexual activity: Not Currently  Other Topics Concern   Not on file  Social History Narrative   Not on file   Social Determinants of Health   Financial Resource Strain: Not on file  Food Insecurity: Not on file  Transportation Needs: Not on file  Physical Activity: Not on file  Stress: Not on file  Social Connections: Not on file  Intimate Partner Violence: Not on file    No past surgical history on file.  Family History  Problem Relation Age of Onset   Cancer Neg Hx     Allergies  Allergen Reactions   Latex Rash    Current Outpatient Medications on File Prior to Visit  Medication Sig Dispense Refill   albuterol (VENTOLIN HFA) 108 (90 Base) MCG/ACT inhaler Inhale 2 puffs into the lungs every 6 (six) hours as needed. 18 g 0   meclizine (ANTIVERT) 12.5 MG tablet Take 1 tablet (12.5 mg total) by mouth 3 (three) times daily as needed for dizziness. (Patient not taking: No sig reported) 10 tablet 0   medroxyPROGESTERone (PROVERA) 10 MG tablet Take 1 tablet (10 mg total) by mouth daily. Use for ten days (Patient not taking: Reported on 07/17/2020) 10 tablet 0   No current facility-administered medications on file prior to visit.    BP 123/69    Pulse 76    Temp 98.1 F (36.7 C) (  Oral)    Resp 18    Ht 5\' 1"  (1.549 m)    Wt 168 lb (76.2 kg)    LMP 07/13/2020    SpO2 100%    BMI 31.74 kg/m       Objective:   Physical Exam  General- No acute distress. Pleasant patient. Neck- Full range of motion, no jvd Lungs- Clear, even and unlabored. Heart- regular rate and rhythm. Neurologic- CNII- XII grossly intact.       Assessment & Plan:  Filled out your form today.   Your atypical chest pain has resolved. You describe random tight sensation that responds/resolves quickly with albuterol use. With your marijuana smoking I think you have some reactive airways. I do think best for you to  stop use. If you have worse symptoms or cardiac type symptoms then refer to cardiologist. Any severe symptoms then ED evaluation.  For fatigue with lower end b12 level recommend getting b12 5000 mcg tabs otc and take daily.   Follow up in one month office visit. On that day will get repeat b12 level.  07/15/2020, PA-C

## 2020-07-17 NOTE — Patient Instructions (Signed)
Filled out your form today.   Your atypical chest pain has resolved. You describe random tight sensation that responds/resolves quickly with albuterol use. With your marijuana smoking I think you have some reactive airways. I do think best for you to stop use. If you have worse symptoms or cardiac type symptoms then refer to cardiologist. Any severe symptoms then ED evaluation.  For fatigue with lower end b12 level recommend getting b12 5000 mcg tabs otc and take daily.   Follow up in one month office visit. On that day will get repeat b12 level.

## 2020-07-18 DIAGNOSIS — F411 Generalized anxiety disorder: Secondary | ICD-10-CM | POA: Diagnosis not present

## 2020-07-18 DIAGNOSIS — F4310 Post-traumatic stress disorder, unspecified: Secondary | ICD-10-CM | POA: Diagnosis not present

## 2020-07-26 ENCOUNTER — Ambulatory Visit (HOSPITAL_BASED_OUTPATIENT_CLINIC_OR_DEPARTMENT_OTHER): Admission: RE | Admit: 2020-07-26 | Payer: BC Managed Care – PPO | Source: Ambulatory Visit

## 2020-07-26 ENCOUNTER — Telehealth: Payer: Self-pay

## 2020-07-26 DIAGNOSIS — Z3202 Encounter for pregnancy test, result negative: Secondary | ICD-10-CM | POA: Diagnosis not present

## 2020-07-26 DIAGNOSIS — R1084 Generalized abdominal pain: Secondary | ICD-10-CM | POA: Diagnosis not present

## 2020-07-26 DIAGNOSIS — U071 COVID-19: Secondary | ICD-10-CM | POA: Diagnosis not present

## 2020-07-26 NOTE — Telephone Encounter (Signed)
Nurse Assessment Nurse: May, RN, Tammy Date/Time Lamount Cohen Time): 07/26/2020 8:01:17 AM Confirm and document reason for call. If symptomatic, describe symptoms. ---Caller states she started a couple days ago with chills, congestion, and sore throat. Caller states for 3 months she has been having abdominal pain. Does the patient have any new or worsening symptoms? ---Yes Will a triage be completed? ---Yes Related visit to physician within the last 2 weeks? ---No Does the PT have any chronic conditions? (i.e. diabetes, asthma, this includes High risk factors for pregnancy, etc.) ---No Is the patient pregnant or possibly pregnant? (Ask all females between the ages of 45-55) ---No Is this a behavioral health or substance abuse call? ---No Guidelines Guideline Title Affirmed Question Affirmed Notes Nurse Date/Time (Eastern Time) COVID-19 - Diagnosed or Suspected MODERATE difficulty breathing (e.g., speaks in phrases, SOB even at rest, pulse 100-120) May, RN, Tammy 07/26/2020 8:02:35 AM Disp. Time Lamount Cohen Time) Disposition Final User 07/26/2020 8:06:01 AM Go to ED Now Yes May, RN, Tammy Caller Disagree/Comply Comply Caller Understands Yes PLEASE NOTE: All timestamps contained within this report are represented as [place] Standard Time. CONFIDENTIALTY NOTICE: This fax transmission is intended only for the addressee. It contains information that is legally privileged, confidential or otherwise protected from use or disclosure. If you are not the intended recipient, you are strictly prohibited from reviewing, disclosing, copying using or disseminating any of this information or taking any action in reliance on or regarding this information. If you have received this fax in error, please notify us immediately by telephone so that we can arrange for its return to [place]. Phone: [phone], Toll-Free: [phone], Fax: [phone] Page: 2 of 2 Call Id: [id number] PreDisposition Did not know what to  do Care Advice Given Per Guideline GO TO ED NOW: * You need to be seen in the Emergency Department. * Go to the ED at ___________ Hospital. * Leave now. Drive carefully. WEAR A MASK - COVER YOUR MOUTH AND NOSE: * Wear a mask. * Tell them you have symptoms and have been sent for COVID-19 testing. CARE ADVICE given per COVID-19 - DIAGNOSED OR SUSPECTED (Adult) guideline. Referrals GO TO FACILITY UNDECIDED

## 2020-07-29 NOTE — Telephone Encounter (Addendum)
I called pt and she went to ED. Dx with covid. Disregard last note.

## 2020-08-01 ENCOUNTER — Ambulatory Visit: Payer: BC Managed Care – PPO | Admitting: Family Medicine

## 2020-08-01 DIAGNOSIS — F4310 Post-traumatic stress disorder, unspecified: Secondary | ICD-10-CM | POA: Diagnosis not present

## 2020-08-01 DIAGNOSIS — F411 Generalized anxiety disorder: Secondary | ICD-10-CM | POA: Diagnosis not present

## 2020-08-13 DIAGNOSIS — F4310 Post-traumatic stress disorder, unspecified: Secondary | ICD-10-CM | POA: Diagnosis not present

## 2020-08-13 DIAGNOSIS — F411 Generalized anxiety disorder: Secondary | ICD-10-CM | POA: Diagnosis not present

## 2020-08-15 ENCOUNTER — Other Ambulatory Visit: Payer: Self-pay

## 2020-08-15 ENCOUNTER — Encounter: Payer: Self-pay | Admitting: Family Medicine

## 2020-08-15 ENCOUNTER — Ambulatory Visit (INDEPENDENT_AMBULATORY_CARE_PROVIDER_SITE_OTHER): Payer: BC Managed Care – PPO | Admitting: Family Medicine

## 2020-08-15 VITALS — BP 130/84 | HR 101 | Ht 59.0 in | Wt 172.0 lb

## 2020-08-15 DIAGNOSIS — N911 Secondary amenorrhea: Secondary | ICD-10-CM

## 2020-08-15 NOTE — Progress Notes (Signed)
   Subjective:    Patient ID: Alexis Sanchez, female    DOB: October 14, 1995, 25 y.o.   MRN: [id number]  HPI Patient seen for follow up of amenorrhea. Lab testing all normal. Had period Dec 25th and lasted for 7 days. Heavy for 5 days. No period this month. No other complaint.   Review of Systems     Objective:   Physical Exam Vitals reviewed.  Constitutional:      Appearance: Normal appearance.  Skin:    General: Skin is warm.     Capillary Refill: Capillary refill takes less than 2 seconds.  Neurological:     General: No focal deficit present.     Mental Status: She is alert.  Psychiatric:        Mood and Affect: Mood normal.        Behavior: Behavior normal.        Thought Content: Thought content normal.        Judgment: Judgment normal.       Assessment & Plan:  1. Secondary amenorrhea Will have patient keep track of menses for next few months to see if pattern emerges.

## 2020-08-19 ENCOUNTER — Ambulatory Visit: Payer: BC Managed Care – PPO | Admitting: Medical

## 2020-11-24 IMAGING — CT CT ANGIO NECK
1 of 11 series · 5 of 33 positions shown · IV contrast (omnipaque)
Comparison: None.

CLINICAL DATA: Trauma

EXAM:
CT ANGIOGRAPHY HEAD AND NECK
TECHNIQUE: Multidetector CT imaging of the head and neck was performed using
the standard protocol during bolus administration of intravenous
contrast. Multiplanar CT image reconstructions and MIPs were
obtained to evaluate the vascular anatomy. Carotid stenosis
measurements (when applicable) are obtained utilizing NASCET
criteria, using the distal internal carotid diameter as the
denominator.
CONTRAST:  75mL OMNIPAQUE IOHEXOL 350 MG/ML SOLN

[Series 11: ax thin · axial · 0.34mm/px · z∈[+1505,+1707]mm · 5 of 304 slices shown]
[im 51/304  soft-tissue]
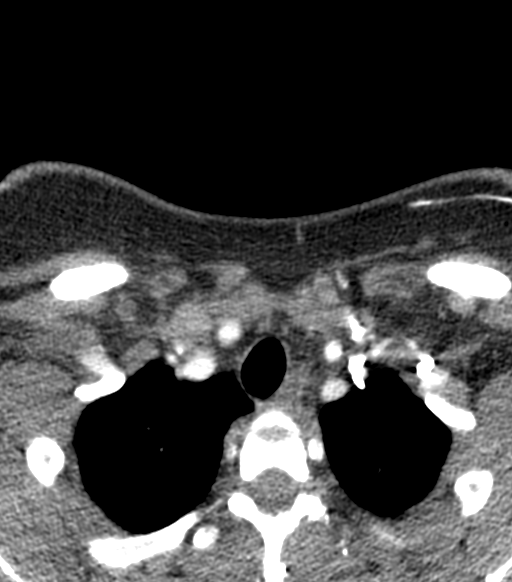
[im 102/304  bone]
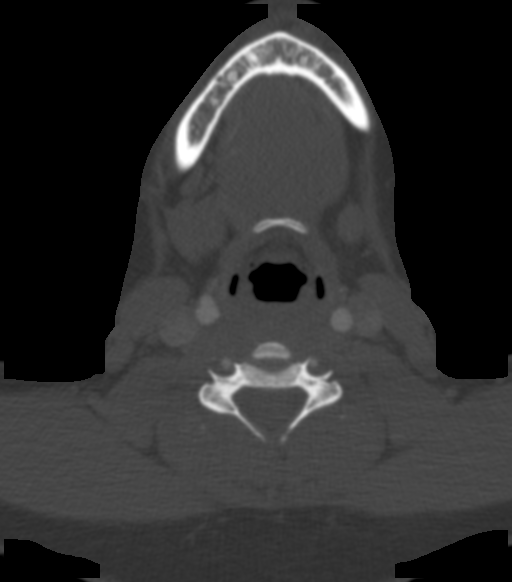
[im 152/304  soft-tissue]
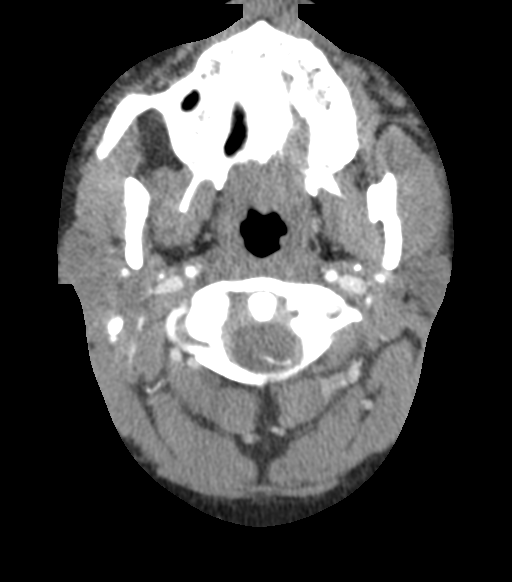
[im 203/304  bone]
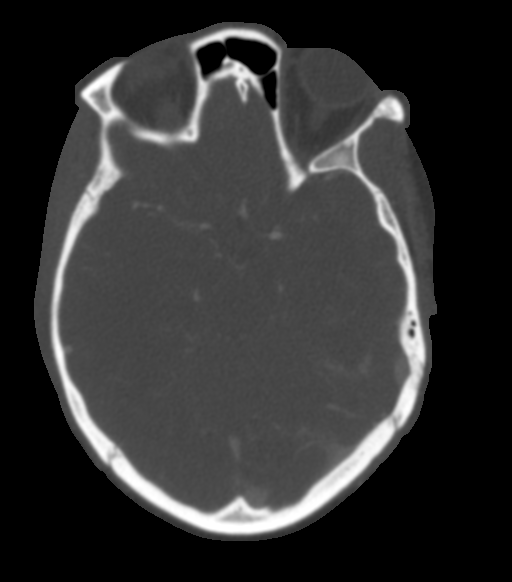
[im 253/304  soft-tissue]
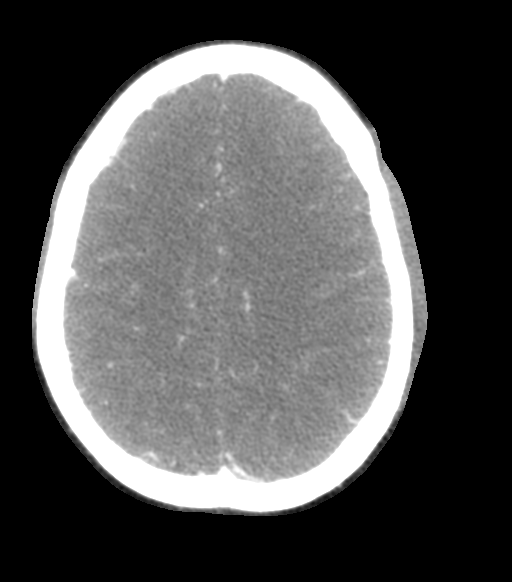

[5 of 33 positions shown; findings below may reference images not displayed]

FINDINGS: CT HEAD FINDINGS

Brain: No acute infarct or intracranial hemorrhage. No mass lesion.
No midline shift, ventriculomegaly or extra-axial fluid collection.

Vascular: No hyperdense vessel or unexpected calcification.

Skull: Linear lucency involving the anterior table of the left
frontal sinus ([DATE], [DATE]) with overlying left frontal scalp soft
tissue swelling.

Sinuses/Orbits: Normal orbits. Clear paranasal sinuses. No mastoid
effusion.

Other: None.

Review of the MIP images confirms the above findings

CTA NECK FINDINGS

Aortic arch: Bovine arch anatomy. Imaged portion shows no evidence
of aneurysm or dissection. No significant stenosis of the major arch
vessel origins.

Right carotid system: No evidence of dissection, stenosis (50% or
greater) or occlusion.

Left carotid system: No evidence of dissection, stenosis (50% or
greater) or occlusion.

Vertebral arteries: Right dominant system. No evidence of
dissection, stenosis (50% or greater) or occlusion.

Skeleton: No acute or suspicious osseous abnormalities within the
neck.

Other neck: No adenopathy.  No soft tissue mass.

Upper chest: Subsegmental atelectasis.

Review of the MIP images confirms the above findings

CTA HEAD FINDINGS

Anterior circulation: No significant stenosis, proximal occlusion,
aneurysm, or vascular malformation.

Posterior circulation: No significant stenosis, proximal occlusion,
aneurysm, or vascular malformation.

Venous sinuses: As permitted by contrast timing, patent.

Anatomic variants: Fetal origin of the right PCA.

Review of the MIP images confirms the above findings
IMPRESSION: Nondisplaced fracture involving the left frontal sinus anterior
table. Small overlying scalp hematoma.

No acute intracranial process.

No acute vascular injury within the head or neck.

## 2021-10-06 DIAGNOSIS — Z3481 Encounter for supervision of other normal pregnancy, first trimester: Secondary | ICD-10-CM | POA: Diagnosis not present

## 2021-10-07 DIAGNOSIS — Z3687 Encounter for antenatal screening for uncertain dates: Secondary | ICD-10-CM | POA: Diagnosis not present

## 2021-10-07 DIAGNOSIS — O99211 Obesity complicating pregnancy, first trimester: Secondary | ICD-10-CM | POA: Diagnosis not present

## 2021-10-07 DIAGNOSIS — Z114 Encounter for screening for human immunodeficiency virus [HIV]: Secondary | ICD-10-CM | POA: Diagnosis not present

## 2021-10-07 DIAGNOSIS — Z3481 Encounter for supervision of other normal pregnancy, first trimester: Secondary | ICD-10-CM | POA: Diagnosis not present

## 2021-10-07 DIAGNOSIS — F1290 Cannabis use, unspecified, uncomplicated: Secondary | ICD-10-CM | POA: Diagnosis not present

## 2021-11-03 DIAGNOSIS — Z01419 Encounter for gynecological examination (general) (routine) without abnormal findings: Secondary | ICD-10-CM | POA: Diagnosis not present

## 2021-11-03 DIAGNOSIS — Z113 Encounter for screening for infections with a predominantly sexual mode of transmission: Secondary | ICD-10-CM | POA: Diagnosis not present

## 2021-11-03 DIAGNOSIS — Z124 Encounter for screening for malignant neoplasm of cervix: Secondary | ICD-10-CM | POA: Diagnosis not present

## 2021-11-03 DIAGNOSIS — Z3481 Encounter for supervision of other normal pregnancy, first trimester: Secondary | ICD-10-CM | POA: Diagnosis not present

## 2021-11-10 DIAGNOSIS — Z36 Encounter for antenatal screening for chromosomal anomalies: Secondary | ICD-10-CM | POA: Diagnosis not present

## 2021-11-10 DIAGNOSIS — Z3682 Encounter for antenatal screening for nuchal translucency: Secondary | ICD-10-CM | POA: Diagnosis not present

## 2021-12-02 DIAGNOSIS — Z3482 Encounter for supervision of other normal pregnancy, second trimester: Secondary | ICD-10-CM | POA: Diagnosis not present

## 2021-12-02 DIAGNOSIS — Z363 Encounter for antenatal screening for malformations: Secondary | ICD-10-CM | POA: Diagnosis not present

## 2021-12-30 DIAGNOSIS — Z363 Encounter for antenatal screening for malformations: Secondary | ICD-10-CM | POA: Diagnosis not present

## 2021-12-30 DIAGNOSIS — O99212 Obesity complicating pregnancy, second trimester: Secondary | ICD-10-CM | POA: Diagnosis not present

## 2022-02-04 DIAGNOSIS — M5489 Other dorsalgia: Secondary | ICD-10-CM | POA: Diagnosis not present

## 2022-02-24 DIAGNOSIS — Z3402 Encounter for supervision of normal first pregnancy, second trimester: Secondary | ICD-10-CM | POA: Diagnosis not present

## 2022-02-24 DIAGNOSIS — Z23 Encounter for immunization: Secondary | ICD-10-CM | POA: Diagnosis not present

## 2022-02-24 DIAGNOSIS — Z3483 Encounter for supervision of other normal pregnancy, third trimester: Secondary | ICD-10-CM | POA: Diagnosis not present

## 2022-03-10 DIAGNOSIS — O99810 Abnormal glucose complicating pregnancy: Secondary | ICD-10-CM | POA: Diagnosis not present

## 2022-04-06 DIAGNOSIS — O99019 Anemia complicating pregnancy, unspecified trimester: Secondary | ICD-10-CM | POA: Diagnosis not present

## 2022-04-06 DIAGNOSIS — O99213 Obesity complicating pregnancy, third trimester: Secondary | ICD-10-CM | POA: Diagnosis not present

## 2022-04-06 DIAGNOSIS — O24913 Unspecified diabetes mellitus in pregnancy, third trimester: Secondary | ICD-10-CM | POA: Diagnosis not present

## 2022-04-20 DIAGNOSIS — Z3483 Encounter for supervision of other normal pregnancy, third trimester: Secondary | ICD-10-CM | POA: Diagnosis not present

## 2022-04-20 DIAGNOSIS — Z3685 Encounter for antenatal screening for Streptococcus B: Secondary | ICD-10-CM | POA: Diagnosis not present

## 2022-05-04 DIAGNOSIS — O99213 Obesity complicating pregnancy, third trimester: Secondary | ICD-10-CM | POA: Diagnosis not present

## 2022-05-04 DIAGNOSIS — O24913 Unspecified diabetes mellitus in pregnancy, third trimester: Secondary | ICD-10-CM | POA: Diagnosis not present

## 2022-05-04 DIAGNOSIS — O365930 Maternal care for other known or suspected poor fetal growth, third trimester, not applicable or unspecified: Secondary | ICD-10-CM | POA: Diagnosis not present

## 2022-05-11 DIAGNOSIS — O99213 Obesity complicating pregnancy, third trimester: Secondary | ICD-10-CM | POA: Diagnosis not present

## 2022-05-11 DIAGNOSIS — O24913 Unspecified diabetes mellitus in pregnancy, third trimester: Secondary | ICD-10-CM | POA: Diagnosis not present

## 2022-05-13 DIAGNOSIS — Z3A39 39 weeks gestation of pregnancy: Secondary | ICD-10-CM | POA: Diagnosis not present

## 2022-05-13 DIAGNOSIS — Z91040 Latex allergy status: Secondary | ICD-10-CM | POA: Diagnosis not present

## 2022-05-13 DIAGNOSIS — O34211 Maternal care for low transverse scar from previous cesarean delivery: Secondary | ICD-10-CM | POA: Diagnosis not present

## 2022-05-13 DIAGNOSIS — Z87891 Personal history of nicotine dependence: Secondary | ICD-10-CM | POA: Diagnosis not present

## 2022-05-13 DIAGNOSIS — O365910 Maternal care for other known or suspected poor fetal growth, first trimester, not applicable or unspecified: Secondary | ICD-10-CM | POA: Diagnosis not present

## 2022-05-13 DIAGNOSIS — O24420 Gestational diabetes mellitus in childbirth, diet controlled: Secondary | ICD-10-CM | POA: Diagnosis not present

## 2022-05-13 DIAGNOSIS — Z79899 Other long term (current) drug therapy: Secondary | ICD-10-CM | POA: Diagnosis not present

## 2022-05-13 DIAGNOSIS — Z91410 Personal history of adult physical and sexual abuse: Secondary | ICD-10-CM | POA: Diagnosis not present

## 2022-05-13 DIAGNOSIS — O24419 Gestational diabetes mellitus in pregnancy, unspecified control: Secondary | ICD-10-CM | POA: Diagnosis not present

## 2022-05-13 DIAGNOSIS — O365930 Maternal care for other known or suspected poor fetal growth, third trimester, not applicable or unspecified: Secondary | ICD-10-CM | POA: Diagnosis not present

## 2022-06-23 DIAGNOSIS — Z6835 Body mass index (BMI) 35.0-35.9, adult: Secondary | ICD-10-CM | POA: Diagnosis not present

## 2022-09-02 DIAGNOSIS — M6281 Muscle weakness (generalized): Secondary | ICD-10-CM | POA: Diagnosis not present

## 2022-09-02 DIAGNOSIS — M6289 Other specified disorders of muscle: Secondary | ICD-10-CM | POA: Diagnosis not present

## 2022-09-02 DIAGNOSIS — N3946 Mixed incontinence: Secondary | ICD-10-CM | POA: Diagnosis not present

## 2022-09-09 DIAGNOSIS — M6281 Muscle weakness (generalized): Secondary | ICD-10-CM | POA: Diagnosis not present

## 2022-09-09 DIAGNOSIS — M6289 Other specified disorders of muscle: Secondary | ICD-10-CM | POA: Diagnosis not present

## 2022-09-09 DIAGNOSIS — N3946 Mixed incontinence: Secondary | ICD-10-CM | POA: Diagnosis not present

## 2022-09-16 DIAGNOSIS — N3946 Mixed incontinence: Secondary | ICD-10-CM | POA: Diagnosis not present

## 2022-09-16 DIAGNOSIS — M6281 Muscle weakness (generalized): Secondary | ICD-10-CM | POA: Diagnosis not present

## 2022-09-16 DIAGNOSIS — M6289 Other specified disorders of muscle: Secondary | ICD-10-CM | POA: Diagnosis not present

## 2022-09-23 DIAGNOSIS — M6281 Muscle weakness (generalized): Secondary | ICD-10-CM | POA: Diagnosis not present

## 2022-09-23 DIAGNOSIS — M6289 Other specified disorders of muscle: Secondary | ICD-10-CM | POA: Diagnosis not present

## 2022-09-23 DIAGNOSIS — N3946 Mixed incontinence: Secondary | ICD-10-CM | POA: Diagnosis not present

## 2022-09-30 DIAGNOSIS — M6281 Muscle weakness (generalized): Secondary | ICD-10-CM | POA: Diagnosis not present

## 2022-09-30 DIAGNOSIS — M6289 Other specified disorders of muscle: Secondary | ICD-10-CM | POA: Diagnosis not present

## 2022-09-30 DIAGNOSIS — N3946 Mixed incontinence: Secondary | ICD-10-CM | POA: Diagnosis not present

## 2022-10-14 DIAGNOSIS — M6281 Muscle weakness (generalized): Secondary | ICD-10-CM | POA: Diagnosis not present

## 2022-10-14 DIAGNOSIS — N3946 Mixed incontinence: Secondary | ICD-10-CM | POA: Diagnosis not present

## 2022-10-14 DIAGNOSIS — M6289 Other specified disorders of muscle: Secondary | ICD-10-CM | POA: Diagnosis not present

## 2022-10-29 DIAGNOSIS — M6289 Other specified disorders of muscle: Secondary | ICD-10-CM | POA: Diagnosis not present

## 2022-10-29 DIAGNOSIS — M6281 Muscle weakness (generalized): Secondary | ICD-10-CM | POA: Diagnosis not present

## 2022-10-29 DIAGNOSIS — N3946 Mixed incontinence: Secondary | ICD-10-CM | POA: Diagnosis not present

## 2022-11-11 DIAGNOSIS — M6289 Other specified disorders of muscle: Secondary | ICD-10-CM | POA: Diagnosis not present

## 2022-11-11 DIAGNOSIS — M6281 Muscle weakness (generalized): Secondary | ICD-10-CM | POA: Diagnosis not present

## 2022-11-11 DIAGNOSIS — N3946 Mixed incontinence: Secondary | ICD-10-CM | POA: Diagnosis not present

## 2022-11-18 DIAGNOSIS — H66001 Acute suppurative otitis media without spontaneous rupture of ear drum, right ear: Secondary | ICD-10-CM | POA: Diagnosis not present

## 2023-04-08 DIAGNOSIS — Z113 Encounter for screening for infections with a predominantly sexual mode of transmission: Secondary | ICD-10-CM | POA: Diagnosis not present

## 2023-04-08 DIAGNOSIS — Z139 Encounter for screening, unspecified: Secondary | ICD-10-CM | POA: Diagnosis not present

## 2023-04-08 DIAGNOSIS — Z1331 Encounter for screening for depression: Secondary | ICD-10-CM | POA: Diagnosis not present

## 2023-04-08 DIAGNOSIS — F331 Major depressive disorder, recurrent, moderate: Secondary | ICD-10-CM | POA: Diagnosis not present

## 2023-04-08 DIAGNOSIS — Z13220 Encounter for screening for lipoid disorders: Secondary | ICD-10-CM | POA: Diagnosis not present

## 2023-04-08 DIAGNOSIS — E559 Vitamin D deficiency, unspecified: Secondary | ICD-10-CM | POA: Diagnosis not present

## 2023-04-08 DIAGNOSIS — R7303 Prediabetes: Secondary | ICD-10-CM | POA: Diagnosis not present

## 2023-04-08 DIAGNOSIS — Z1329 Encounter for screening for other suspected endocrine disorder: Secondary | ICD-10-CM | POA: Diagnosis not present

## 2023-04-08 DIAGNOSIS — R197 Diarrhea, unspecified: Secondary | ICD-10-CM | POA: Diagnosis not present

## 2023-06-28 DIAGNOSIS — N926 Irregular menstruation, unspecified: Secondary | ICD-10-CM | POA: Diagnosis not present

## 2023-06-28 DIAGNOSIS — Z3201 Encounter for pregnancy test, result positive: Secondary | ICD-10-CM | POA: Diagnosis not present

## 2023-06-29 DIAGNOSIS — O3680X0 Pregnancy with inconclusive fetal viability, not applicable or unspecified: Secondary | ICD-10-CM | POA: Diagnosis not present

## 2023-07-15 DIAGNOSIS — Z3491 Encounter for supervision of normal pregnancy, unspecified, first trimester: Secondary | ICD-10-CM | POA: Diagnosis not present

## 2023-07-15 DIAGNOSIS — Z3A12 12 weeks gestation of pregnancy: Secondary | ICD-10-CM | POA: Diagnosis not present
